# Patient Record
Sex: Female | Born: 1970 | Race: White | Hispanic: Yes | Marital: Single | Smoking: Never smoker
Health system: Southern US, Community
[De-identification: ages and names within clinical notes are randomized; demographics above are authoritative.]

---

## 2002-04-12 ENCOUNTER — Ambulatory Visit (HOSPITAL_COMMUNITY): Admission: RE | Admit: 2002-04-12 | Discharge: 2002-04-12 | Payer: Self-pay | Admitting: *Deleted

## 2002-05-11 ENCOUNTER — Inpatient Hospital Stay (HOSPITAL_COMMUNITY): Admission: AD | Admit: 2002-05-11 | Discharge: 2002-05-13 | Payer: Self-pay | Admitting: *Deleted

## 2007-06-24 ENCOUNTER — Emergency Department (HOSPITAL_COMMUNITY): Admission: EM | Admit: 2007-06-24 | Discharge: 2007-06-24 | Payer: Self-pay | Admitting: Emergency Medicine

## 2008-10-30 ENCOUNTER — Ambulatory Visit (HOSPITAL_COMMUNITY): Admission: RE | Admit: 2008-10-30 | Discharge: 2008-10-30 | Payer: Self-pay | Admitting: Family Medicine

## 2008-11-27 ENCOUNTER — Ambulatory Visit (HOSPITAL_COMMUNITY): Admission: RE | Admit: 2008-11-27 | Discharge: 2008-11-27 | Payer: Self-pay | Admitting: Obstetrics & Gynecology

## 2009-03-06 ENCOUNTER — Inpatient Hospital Stay (HOSPITAL_COMMUNITY): Admission: AD | Admit: 2009-03-06 | Discharge: 2009-03-08 | Payer: Self-pay | Admitting: Obstetrics and Gynecology

## 2009-03-06 ENCOUNTER — Ambulatory Visit: Payer: Self-pay | Admitting: Obstetrics & Gynecology

## 2010-10-16 LAB — CBC
HCT: 39.5 % (ref 36.0–46.0)
Hemoglobin: 13.1 g/dL (ref 12.0–15.0)
MCHC: 33.2 g/dL (ref 30.0–36.0)
MCV: 91.9 fL (ref 78.0–100.0)
RBC: 4.29 MIL/uL (ref 3.87–5.11)
WBC: 9.6 10*3/uL (ref 4.0–10.5)

## 2010-10-16 LAB — RPR: RPR Ser Ql: NONREACTIVE

## 2011-04-18 LAB — CBC
HCT: 39.6
Hemoglobin: 13.3
MCHC: 33.5
MCV: 86.1
Platelets: 264
RDW: 13.1

## 2011-04-18 LAB — PREGNANCY, URINE: Preg Test, Ur: NEGATIVE

## 2011-04-18 LAB — COMPREHENSIVE METABOLIC PANEL
Albumin: 3.3 — ABNORMAL LOW
Alkaline Phosphatase: 79
BUN: 4 — ABNORMAL LOW
Calcium: 9
Creatinine, Ser: 0.8
Glucose, Bld: 94
Potassium: 3.1 — ABNORMAL LOW
Total Protein: 7.9

## 2011-04-18 LAB — URINALYSIS, ROUTINE W REFLEX MICROSCOPIC
Ketones, ur: 15 — AB
Nitrite: POSITIVE — AB
Protein, ur: 30 — AB
pH: 6

## 2011-04-18 LAB — DIFFERENTIAL
Basophils Relative: 0
Lymphocytes Relative: 14
Monocytes Absolute: 1.6 — ABNORMAL HIGH
Monocytes Relative: 10
Neutro Abs: 12.7 — ABNORMAL HIGH
Neutrophils Relative %: 77

## 2011-04-18 LAB — URINE MICROSCOPIC-ADD ON

## 2016-04-16 ENCOUNTER — Emergency Department (HOSPITAL_COMMUNITY): Payer: Commercial Managed Care - PPO

## 2016-04-16 ENCOUNTER — Emergency Department (HOSPITAL_COMMUNITY)
Admission: EM | Admit: 2016-04-16 | Discharge: 2016-04-17 | Disposition: A | Discharge status: Home / Self Care | Payer: Commercial Managed Care - PPO | Attending: Emergency Medicine | Admitting: Emergency Medicine

## 2016-04-16 ENCOUNTER — Encounter (HOSPITAL_COMMUNITY): Payer: Self-pay | Admitting: *Deleted

## 2016-04-16 DIAGNOSIS — R109 Unspecified abdominal pain: Secondary | ICD-10-CM | POA: Diagnosis present

## 2016-04-16 LAB — COMPREHENSIVE METABOLIC PANEL
ALK PHOS: 69 U/L (ref 38–126)
ALT: 16 U/L (ref 14–54)
AST: 21 U/L (ref 15–41)
Albumin: 3.4 g/dL — ABNORMAL LOW (ref 3.5–5.0)
Anion gap: 7 (ref 5–15)
BILIRUBIN TOTAL: 0.5 mg/dL (ref 0.3–1.2)
BUN: 13 mg/dL (ref 6–20)
CALCIUM: 9 mg/dL (ref 8.9–10.3)
CHLORIDE: 111 mmol/L (ref 101–111)
CO2: 22 mmol/L (ref 22–32)
CREATININE: 0.61 mg/dL (ref 0.44–1.00)
Glucose, Bld: 109 mg/dL — ABNORMAL HIGH (ref 65–99)
Potassium: 3.5 mmol/L (ref 3.5–5.1)
Sodium: 140 mmol/L (ref 135–145)
TOTAL PROTEIN: 6.7 g/dL (ref 6.5–8.1)

## 2016-04-16 LAB — PREGNANCY, URINE: PREG TEST UR: NEGATIVE

## 2016-04-16 LAB — URINALYSIS, ROUTINE W REFLEX MICROSCOPIC
Bilirubin Urine: NEGATIVE
GLUCOSE, UA: NEGATIVE mg/dL
Ketones, ur: 15 mg/dL — AB
NITRITE: NEGATIVE
PH: 6 (ref 5.0–8.0)
Protein, ur: NEGATIVE mg/dL
SPECIFIC GRAVITY, URINE: 1.025 (ref 1.005–1.030)

## 2016-04-16 LAB — CBC
HCT: 37.6 % (ref 36.0–46.0)
Hemoglobin: 11.6 g/dL — ABNORMAL LOW (ref 12.0–15.0)
MCH: 25.2 pg — ABNORMAL LOW (ref 26.0–34.0)
MCHC: 30.9 g/dL (ref 30.0–36.0)
MCV: 81.6 fL (ref 78.0–100.0)
PLATELETS: 311 10*3/uL (ref 150–400)
RBC: 4.61 MIL/uL (ref 3.87–5.11)
RDW: 16.1 % — AB (ref 11.5–15.5)
WBC: 8.4 10*3/uL (ref 4.0–10.5)

## 2016-04-16 LAB — URINE MICROSCOPIC-ADD ON

## 2016-04-16 LAB — LIPASE, BLOOD: LIPASE: 32 U/L (ref 11–51)

## 2016-04-16 MED ORDER — ONDANSETRON HCL 4 MG/2ML IJ SOLN
4.0000 mg | Freq: Once | INTRAMUSCULAR | Status: AC
  Administered 2016-04-16: 4 mg via INTRAVENOUS
  Filled 2016-04-16: qty 2

## 2016-04-16 MED ORDER — IOPAMIDOL (ISOVUE-300) INJECTION 61%
INTRAVENOUS | Status: AC
  Administered 2016-04-16: 100 mL
  Filled 2016-04-16: qty 100

## 2016-04-16 MED ORDER — MORPHINE SULFATE (PF) 4 MG/ML IV SOLN
4.0000 mg | Freq: Once | INTRAVENOUS | Status: AC
  Administered 2016-04-16: 4 mg via INTRAVENOUS
  Filled 2016-04-16: qty 1

## 2016-04-16 NOTE — ED Provider Notes (Signed)
[facility]-EMERGENCY DEPT Provider Note   CSN: [id number] Arrival date & time: 04/16/16  1714     History   Chief Complaint Chief Complaint  Patient presents with  . Abdominal Pain    HPI Prescott Gumva P Moreno-Ruiz is a 45 y.o. female.  Patient c/o rlq abd pain for the past day. Pain constant, dull, moderate, non radiating. No specific exacerbating or alleviating factors. Decreased appetite today. No vomiting or diarrhea. No constipation. Denies fevers. No prior abd surgery. No hx gallstones. Denies dysuria/hematuria or gu c/o. lnmp 3 weeks ago, normal. No vaginal discharge or bleeding. Patient indicates has had a similar pain in the past, but not this persistent.    The history is provided by the patient and a relative. A language interpreter was used.  Abdominal Pain   Pertinent negatives include fever, vomiting, dysuria, hematuria and headaches.    History reviewed. No pertinent past medical history.  There are no active problems to display for this patient.   History reviewed. No pertinent surgical history.  OB History    No data available       Home Medications    Prior to Admission medications   Not on File    Family History History reviewed. No pertinent family history.  Social History Social History  Substance Use Topics  . Smoking status: Never Smoker  . Smokeless tobacco: Not on file  . Alcohol use No     Allergies   Review of patient's allergies indicates no known allergies.   Review of Systems Review of Systems  Constitutional: Negative for fever.  HENT: Negative for sore throat.   Eyes: Negative for redness.  Respiratory: Negative for shortness of breath.   Cardiovascular: Negative for chest pain.  Gastrointestinal: Positive for abdominal pain. Negative for vomiting.  Genitourinary: Negative for dysuria, hematuria, vaginal bleeding and vaginal discharge.  Musculoskeletal: Negative for back pain and neck pain.  Skin: Negative for rash.    Neurological: Negative for headaches.  Psychiatric/Behavioral: Negative for confusion.     Physical Exam Updated Vital Signs BP 123/70 (BP Location: Left Arm)   Pulse (!) 54   Temp 97.5 F (36.4 C) (Oral)   Resp 16   LMP 04/04/2016   SpO2 100%   Physical Exam  Constitutional: She appears well-developed and well-nourished. No distress.  Eyes: Conjunctivae are normal. No scleral icterus.  Neck: Neck supple. No tracheal deviation present.  Cardiovascular: Normal rate, regular rhythm, normal heart sounds and intact distal pulses.   No murmur heard. Pulmonary/Chest: Effort normal and breath sounds normal. No respiratory distress.  Abdominal: Soft. Normal appearance and bowel sounds are normal. She exhibits no distension and no mass. There is tenderness. There is no rebound and no guarding. No hernia.  Moderate right mid to lower abd tenderness.   Genitourinary:  Genitourinary Comments: No cva tenderness  Musculoskeletal: She exhibits no edema.  Neurological: She is alert.  Skin: Skin is warm and dry. No rash noted. She is not diaphoretic.  Psychiatric: She has a normal mood and affect.  Nursing note and vitals reviewed.    ED Treatments / Results  Labs (all labs ordered are listed, but only abnormal results are displayed) Results for orders placed or performed during the hospital encounter of 04/16/16  Lipase, blood  Result Value Ref Range   Lipase 32 11 - 51 U/L  Comprehensive metabolic panel  Result Value Ref Range   Sodium 140 135 - 145 mmol/L   Potassium 3.5 3.5 - 5.1 mmol/L  Chloride 111 101 - 111 mmol/L   CO2 22 22 - 32 mmol/L   Glucose, Bld 109 (H) 65 - 99 mg/dL   BUN 13 6 - 20 mg/dL   Creatinine, Ser 1.61 0.44 - 1.00 mg/dL   Calcium 9.0 8.9 - 09.6 mg/dL   Total Protein 6.7 6.5 - 8.1 g/dL   Albumin 3.4 (L) 3.5 - 5.0 g/dL   AST 21 15 - 41 U/L   ALT 16 14 - 54 U/L   Alkaline Phosphatase 69 38 - 126 U/L   Total Bilirubin 0.5 0.3 - 1.2 mg/dL   GFR calc non Af  Amer >60 >60 mL/min   GFR calc Af Amer >60 >60 mL/min   Anion gap 7 5 - 15  CBC  Result Value Ref Range   WBC 8.4 4.0 - 10.5 K/uL   RBC 4.61 3.87 - 5.11 MIL/uL   Hemoglobin 11.6 (L) 12.0 - 15.0 g/dL   HCT 04.5 40.9 - 81.1 %   MCV 81.6 78.0 - 100.0 fL   MCH 25.2 (L) 26.0 - 34.0 pg   MCHC 30.9 30.0 - 36.0 g/dL   RDW 91.4 (H) 78.2 - 95.6 %   Platelets 311 150 - 400 K/uL  Urinalysis, Routine w reflex microscopic  Result Value Ref Range   Color, Urine YELLOW YELLOW   APPearance HAZY (A) CLEAR   Specific Gravity, Urine 1.025 1.005 - 1.030   pH 6.0 5.0 - 8.0   Glucose, UA NEGATIVE NEGATIVE mg/dL   Hgb urine dipstick LARGE (A) NEGATIVE   Bilirubin Urine NEGATIVE NEGATIVE   Ketones, ur 15 (A) NEGATIVE mg/dL   Protein, ur NEGATIVE NEGATIVE mg/dL   Nitrite NEGATIVE NEGATIVE   Leukocytes, UA TRACE (A) NEGATIVE  Urine microscopic-add on  Result Value Ref Range   Squamous Epithelial / LPF 6-30 (A) NONE SEEN   WBC, UA 0-5 0 - 5 WBC/hpf   RBC / HPF TOO NUMEROUS TO COUNT 0 - 5 RBC/hpf   Bacteria, UA FEW (A) NONE SEEN  Pregnancy, urine  Result Value Ref Range   Preg Test, Ur NEGATIVE NEGATIVE   Ct Abdomen Pelvis W Contrast  Result Date: 04/16/2016 CLINICAL DATA:  Acute onset of right lower quadrant abdominal pain. Initial encounter. EXAM: CT ABDOMEN AND PELVIS WITH CONTRAST TECHNIQUE: Multidetector CT imaging of the abdomen and pelvis was performed using the standard protocol following bolus administration of intravenous contrast. CONTRAST:  ISOVUE-300 IOPAMIDOL (ISOVUE-300) INJECTION 61% COMPARISON:  Pelvic ultrasound performed 11/27/2008 FINDINGS: Lower chest: The visualized lung bases are grossly clear. The visualized portions of the mediastinum are unremarkable. Hepatobiliary: The liver is unremarkable in appearance. The gallbladder is unremarkable in appearance. The common bile duct remains normal in caliber. Pancreas: The pancreas is within normal limits. Spleen: The spleen is  unremarkable in appearance. Adrenals/Urinary Tract: The adrenal glands are unremarkable in appearance. The kidneys are within normal limits. There is no evidence of hydronephrosis. No renal or ureteral stones are identified. No perinephric stranding is seen. Stomach/Bowel: The stomach is unremarkable in appearance. The small bowel is within normal limits. The appendix is normal in caliber, without evidence of appendicitis. The colon is unremarkable in appearance. Vascular/Lymphatic: The abdominal aorta is unremarkable in appearance. The inferior vena cava is grossly unremarkable. No retroperitoneal lymphadenopathy is seen. No pelvic sidewall lymphadenopathy is identified. Reproductive: The patient's intrauterine device is noted in abnormal alignment, with 1 of the prongs extending anteriorly through the myometrium to the edge of the uterine wall. The uterus  is otherwise grossly unremarkable. The ovaries are relatively symmetric. No suspicious adnexal masses are seen. The bladder is mildly distended and grossly unremarkable. Other: No additional soft tissue abnormalities are seen. Musculoskeletal: No acute osseous abnormalities are identified. The visualized musculature is unremarkable in appearance. IMPRESSION: 1. Intrauterine device noted in abnormal alignment, with 1 of the prongs extending anteriorly through the myometrium to the edge of the uterine wall. OB/GYN consultation is recommended for repositioning. 2. Otherwise unremarkable contrast-enhanced CT of the abdomen and pelvis. Electronically Signed   By: Roanna Raider M.D.   On: 04/16/2016 23:37    EKG  EKG Interpretation None       Radiology No results found.  Procedures Procedures (including critical care time)  Medications Ordered in ED Medications - No data to display   Initial Impression / Assessment and Plan / ED Course  I have reviewed the triage vital signs and the nursing notes.  Pertinent labs & imaging results that were  available during my care of the patient were reviewed by me and considered in my medical decision making (see chart for details).  Clinical Course    Iv ns. Labs.  CT.  Morphine iv. zofran iv.   Reviewed nursing notes and prior charts for additional history.   Ct scan neg acute for right abd process.  Incidental note made of iud position.  On recheck of pt, there is no pelvic pain or tenderness - will have f/u ob/gyn as outpatient.   Patient afeb, repeat abd exam reassuring, non tender - rec outpt f/u.  Return precautions provided.      Final Clinical Impressions(s) / ED Diagnoses   Final diagnoses:  None    New Prescriptions New Prescriptions   No medications on file     Cathren Laine, MD 04/16/16 2352

## 2016-04-16 NOTE — ED Triage Notes (Addendum)
Pt reports RLQ pain since last night. Denies n/v/d or urinary symptoms. Denies fever. Reports normal bm this morning.

## 2016-04-17 NOTE — Discharge Instructions (Signed)
It was our pleasure to provide your ER care today - we hope that you feel better.  Your ct scan was read as showing no acute infection/process.    On your scan, incidental note was made of:  Intrauterine device noted in abnormal alignment, with 1 of the prongs extending anteriorly through the myometrium to the edge of the uterine wall - for this, follow up with ob/gyn doctor in the coming week, and discuss possibly re-positioning of your iud - call your ob/gyn doctor this Monday AM to arrange that follow up with them.    Return to ER right away if worse, new symptoms, fevers, worsening or severe abdominal pain, other concern.   You were given pain medication in the ER - no driving for the next 4 hours.

## 2016-04-17 NOTE — ED Notes (Signed)
Pt dc papers reviewed. Pt verbalizes understanding and denies having any questions. Pt attempted to sign dc but pen and pad not working.

## 2017-05-08 IMAGING — CT CT ABD-PELV W/ CM
2 of 5 series · 15 of 46 positions shown, 17 images · IV contrast (Omni 300)
Comparison: Pelvic ultrasound performed 11/27/2008

CLINICAL DATA: Acute onset of right lower quadrant abdominal pain.
Initial encounter.

EXAM:
CT ABDOMEN AND PELVIS WITH CONTRAST
TECHNIQUE: Multidetector CT imaging of the abdomen and pelvis was performed
using the standard protocol following bolus administration of
intravenous contrast.
CONTRAST:  100mL JR32HI-E22 IOPAMIDOL (JR32HI-E22) INJECTION 61%

[Series 2: a/p w/ 5mm · axial · 0.64mm/px · z∈[-738,-333]mm · 12 of 93 slices shown, 14 images]
[im 6/93  soft-tissue]
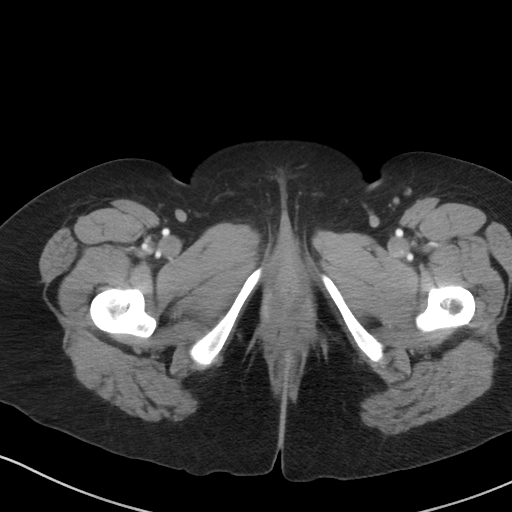
[im 6/93  bone]
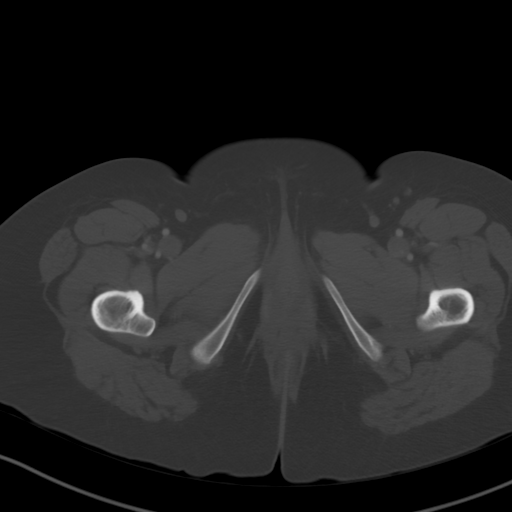
[im 16/93  soft-tissue]
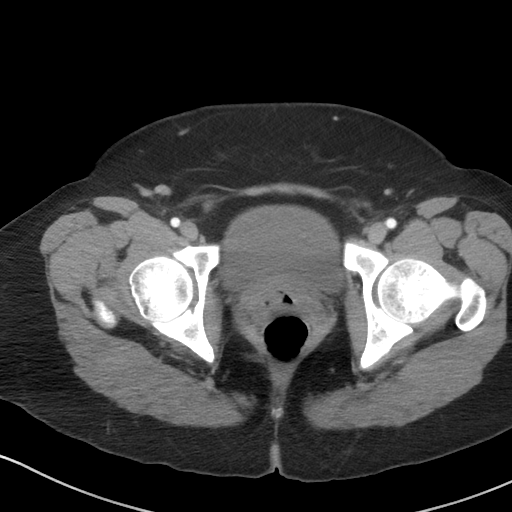
[im 21/93  soft-tissue]
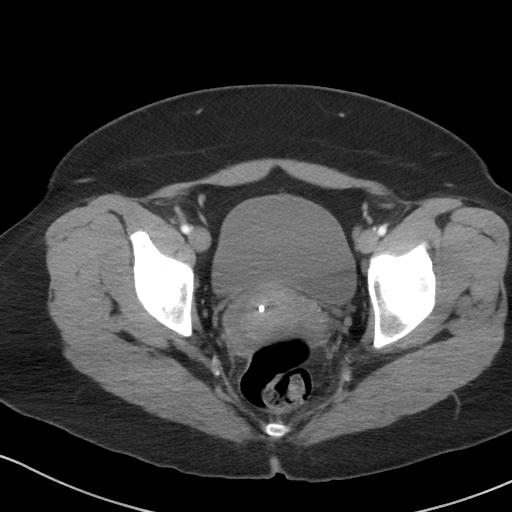
[im 26/93  soft-tissue]
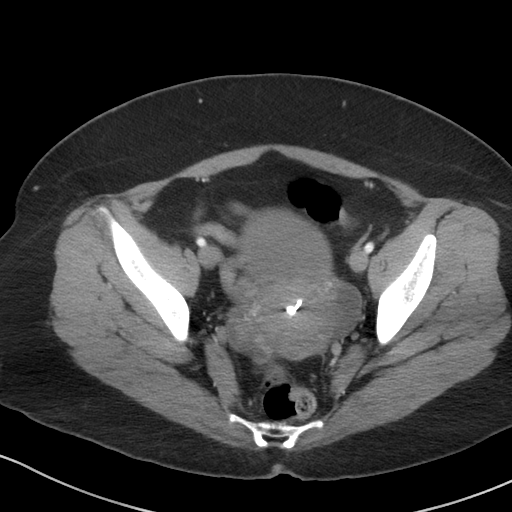
[im 36/93  soft-tissue]
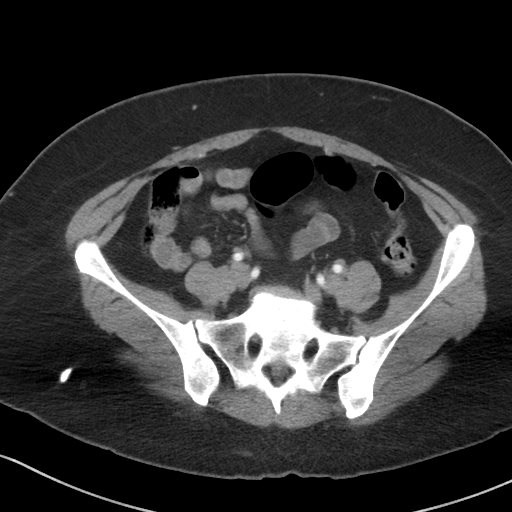
[im 41/93  soft-tissue]
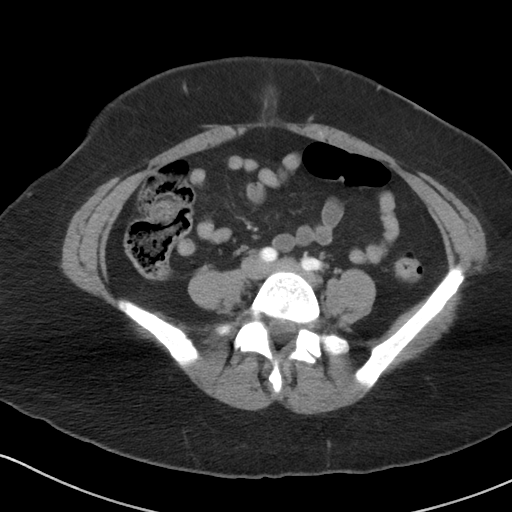
[im 52/93  soft-tissue]
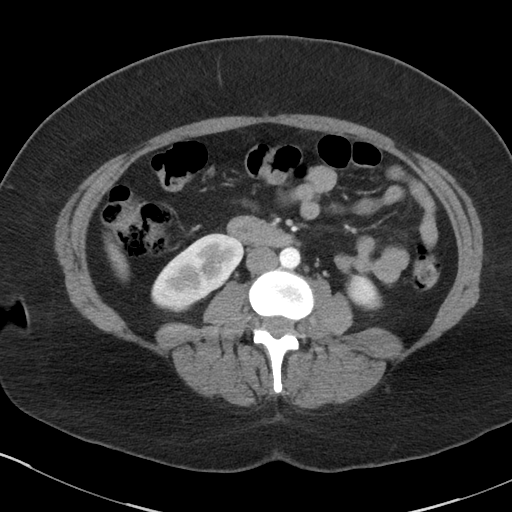
[im 57/93  soft-tissue]
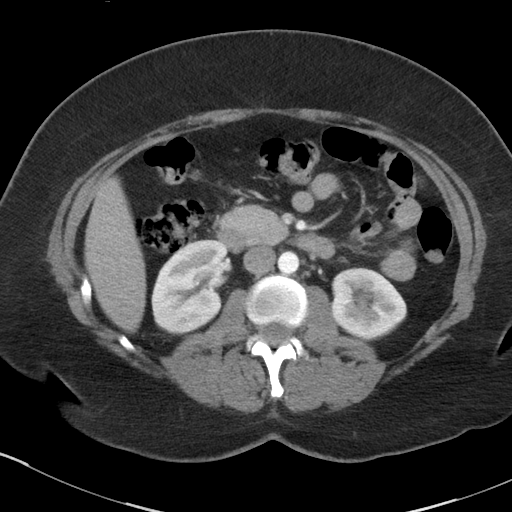
[im 67/93  soft-tissue]
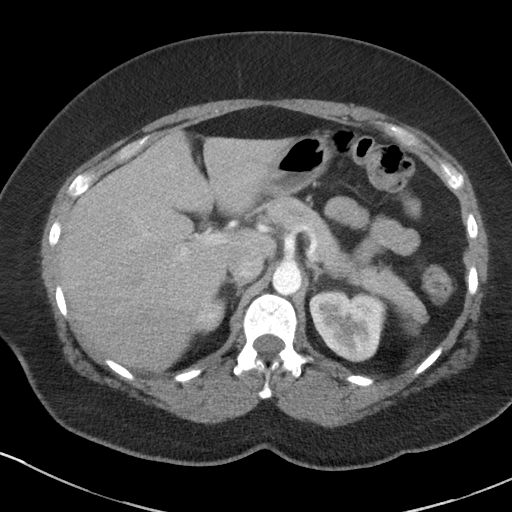
[im 67/93  bone]
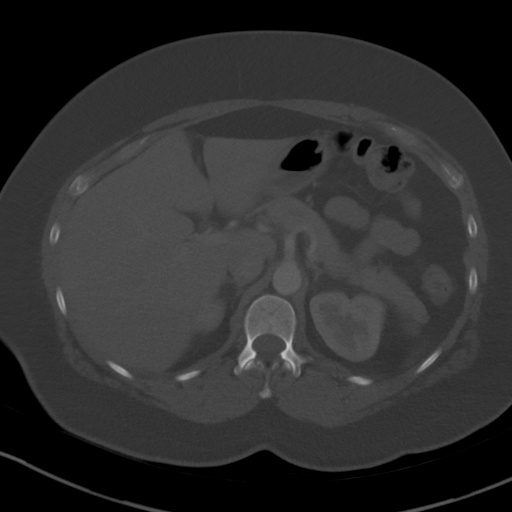
[im 72/93  soft-tissue]
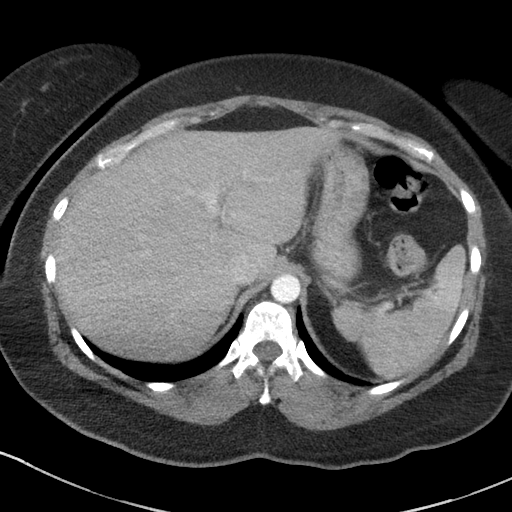
[im 77/93  soft-tissue]
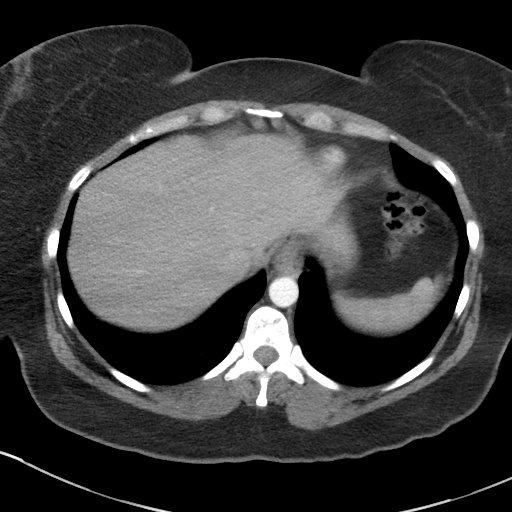
[im 87/93  soft-tissue]
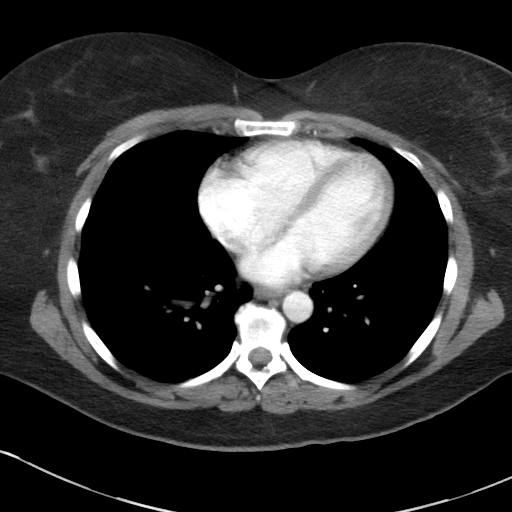

[Series 4: a/p w/ cor · coronal · 0.72mm/px · 3 of 136 slices shown]
[im 46/136  soft-tissue]
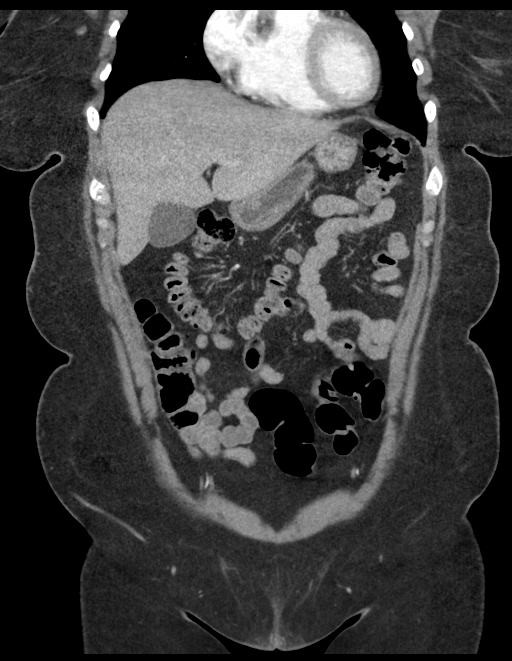
[im 61/136  soft-tissue]
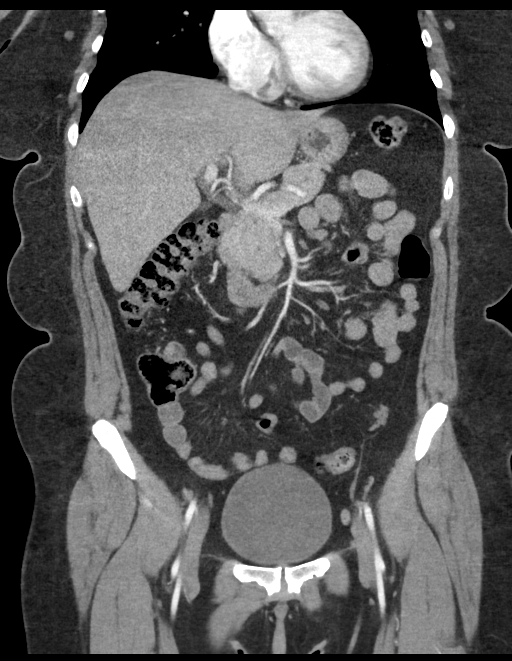
[im 76/136  soft-tissue]
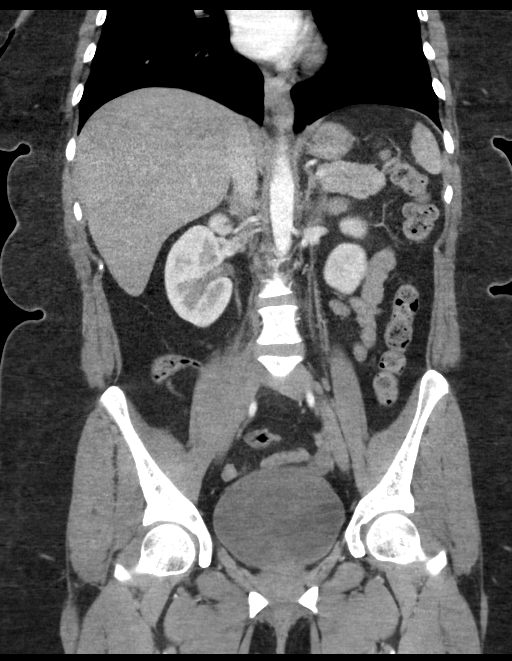

[15 of 46 positions shown; findings below may reference images not displayed]

FINDINGS: Lower chest: The visualized lung bases are grossly clear. The
visualized portions of the mediastinum are unremarkable.

Hepatobiliary: The liver is unremarkable in appearance. The
gallbladder is unremarkable in appearance. The common bile duct
remains normal in caliber.

Pancreas: The pancreas is within normal limits.

Spleen: The spleen is unremarkable in appearance.

Adrenals/Urinary Tract: The adrenal glands are unremarkable in
appearance. The kidneys are within normal limits. There is no
evidence of hydronephrosis. No renal or ureteral stones are
identified. No perinephric stranding is seen.

Stomach/Bowel: The stomach is unremarkable in appearance. The small
bowel is within normal limits. The appendix is normal in caliber,
without evidence of appendicitis. The colon is unremarkable in
appearance.

Vascular/Lymphatic: The abdominal aorta is unremarkable in
appearance. The inferior vena cava is grossly unremarkable. No
retroperitoneal lymphadenopathy is seen. No pelvic sidewall
lymphadenopathy is identified.

Reproductive: The patient's intrauterine device is noted in abnormal
alignment, with 1 of the prongs extending anteriorly through the
myometrium to the edge of the uterine wall. The uterus is otherwise
grossly unremarkable. The ovaries are relatively symmetric. No
suspicious adnexal masses are seen. The bladder is mildly distended
and grossly unremarkable.

Other: No additional soft tissue abnormalities are seen.

Musculoskeletal: No acute osseous abnormalities are identified. The
visualized musculature is unremarkable in appearance.
IMPRESSION: 1. Intrauterine device noted in abnormal alignment, with 1 of the
prongs extending anteriorly through the myometrium to the edge of
the uterine wall. OB/GYN consultation is recommended for
repositioning.
2. Otherwise unremarkable contrast-enhanced CT of the abdomen and
pelvis.

## 2017-10-10 ENCOUNTER — Other Ambulatory Visit: Payer: Self-pay | Admitting: Obstetrics & Gynecology

## 2017-10-10 DIAGNOSIS — Z1231 Encounter for screening mammogram for malignant neoplasm of breast: Secondary | ICD-10-CM

## 2019-07-13 ENCOUNTER — Encounter (HOSPITAL_COMMUNITY): Payer: Self-pay | Admitting: Emergency Medicine

## 2019-07-13 ENCOUNTER — Emergency Department (HOSPITAL_COMMUNITY)
Admission: EM | Admit: 2019-07-13 | Discharge: 2019-07-13 | Disposition: A | Discharge status: Home / Self Care | Payer: HRSA Program | Attending: Emergency Medicine | Admitting: Emergency Medicine

## 2019-07-13 ENCOUNTER — Other Ambulatory Visit: Payer: Self-pay

## 2019-07-13 DIAGNOSIS — U071 COVID-19: Secondary | ICD-10-CM | POA: Insufficient documentation

## 2019-07-13 DIAGNOSIS — R519 Headache, unspecified: Secondary | ICD-10-CM | POA: Diagnosis present

## 2019-07-13 DIAGNOSIS — Z20822 Contact with and (suspected) exposure to covid-19: Secondary | ICD-10-CM

## 2019-07-13 DIAGNOSIS — B349 Viral infection, unspecified: Secondary | ICD-10-CM

## 2019-07-13 LAB — POC SARS CORONAVIRUS 2 AG -  ED: SARS Coronavirus 2 Ag: NEGATIVE

## 2019-07-13 MED ORDER — ACETAMINOPHEN 325 MG PO TABS
650.0000 mg | ORAL_TABLET | Freq: Once | ORAL | Status: AC
  Administered 2019-07-13: 650 mg via ORAL
  Filled 2019-07-13: qty 2

## 2019-07-13 MED ORDER — BENZONATATE 100 MG PO CAPS: 100.0000 mg | ORAL_CAPSULE | Freq: Three times a day (TID) | ORAL | 0 refills | Status: AC

## 2019-07-13 NOTE — ED Notes (Signed)
Pt verbalized understanding of d/c instructions, prescriptions, follow up care, and s/s requiring return to ed. Pt had no further questions at thistime.

## 2019-07-13 NOTE — ED Provider Notes (Signed)
[facility] EMERGENCY DEPARTMENT Provider Note   CSN: [id number] Arrival date & time: 07/13/19  1836     History Chief Complaint  Patient presents with  . Generalized Body Aches  . Headache    Samantha Burke is a 49 y.o. female.  Patient presents to the emergency department with complaint of headache, generalized body ache, of loss of taste and smell, cough.  She had a temperature of 100.9 F in the emergency department.  This is day 9 of illness which started on 12/24.  To me, she states that she is not significantly short of breath, this is in contrast to the nursing note.  Patient has been using over-the-counter medications without improvement.  She denies any known sick contacts with coronavirus or otherwise.  Onset of symptoms gradual.  Course is worsening.  Nothing makes symptoms better or worse.  History taken with the use of video interpreter.        History reviewed. No pertinent past medical history.  There are no problems to display for this patient.   History reviewed. No pertinent surgical history.   OB History   No obstetric history on file.     No family history on file.  Social History   Tobacco Use  . Smoking status: Never Smoker  Substance Use Topics  . Alcohol use: No  . Drug use: No    Home Medications Prior to Admission medications   Medication Sig Start Date End Date Taking? Authorizing Provider  benzonatate (TESSALON) 100 MG capsule Take 1 capsule (100 mg total) by mouth every 8 (eight) hours. 07/13/19   Carlisle Cater, PA-C    Allergies    Patient has no known allergies.  Review of Systems   Review of Systems  Constitutional: Positive for fever (fever in ED). Negative for chills and fatigue.  HENT: Positive for congestion. Negative for ear pain, rhinorrhea, sinus pressure and sore throat.        + change in taste/smell  Eyes: Negative for redness.  Respiratory: Positive for cough. Negative for shortness of breath  and wheezing.   Gastrointestinal: Negative for abdominal pain, diarrhea, nausea and vomiting.  Genitourinary: Negative for dysuria.  Musculoskeletal: Positive for myalgias. Negative for neck stiffness.  Skin: Negative for rash.  Neurological: Positive for headaches.  Hematological: Negative for adenopathy.    Physical Exam Updated Vital Signs BP (!) 127/93 (BP Location: Right Arm)   Pulse 92   Temp (!) 100.9 F (38.3 C) (Oral)   Resp 16   SpO2 97%   Physical Exam Vitals and nursing note reviewed.  Constitutional:      Appearance: She is well-developed.  HENT:     Head: Normocephalic and atraumatic.     Jaw: No trismus.     Right Ear: Tympanic membrane, ear canal and external ear normal.     Left Ear: Tympanic membrane, ear canal and external ear normal.     Nose: Nose normal. No mucosal edema or rhinorrhea.     Mouth/Throat:     Mouth: Mucous membranes are not dry. No oral lesions.     Pharynx: Uvula midline. No oropharyngeal exudate, posterior oropharyngeal erythema or uvula swelling.     Tonsils: No tonsillar abscesses.  Eyes:     General:        Right eye: No discharge.        Left eye: No discharge.     Conjunctiva/sclera: Conjunctivae normal.  Cardiovascular:     Rate and Rhythm:  Normal rate and regular rhythm.     Heart sounds: Normal heart sounds.  Pulmonary:     Effort: Pulmonary effort is normal. No respiratory distress.     Breath sounds: Normal breath sounds. No wheezing or rales.     Comments: Patient occasionally coughing during exam.  Speaks in full sentences, no apparent distress accessory muscle use. Abdominal:     Palpations: Abdomen is soft.     Tenderness: There is no abdominal tenderness.  Musculoskeletal:     Cervical back: Normal range of motion and neck supple.  Lymphadenopathy:     Cervical: No cervical adenopathy.  Skin:    General: Skin is warm and dry.  Neurological:     Mental Status: She is alert.     ED Results / Procedures /  Treatments   Labs (all labs ordered are listed, but only abnormal results are displayed) Labs Reviewed  SARS CORONAVIRUS 2 (TAT 6-24 HRS)  POC SARS CORONAVIRUS 2 AG -  ED    EKG None  Radiology No results found.  Procedures Procedures (including critical care time)  Medications Ordered in ED Medications - No data to display  ED Course  I have reviewed the triage vital signs and the nursing notes.  Pertinent labs & imaging results that were available during my care of the patient were reviewed by me and considered in my medical decision making (see chart for details).  Patient seen and examined.  Patient is well-appearing.  Rapid coronavirus testing negative.  And send out test.  High suspicion for coronavirus given history.  Although her symptoms are mild.  She is in no respiratory distress and looks well.  Feel that she can be discharged home with precautions and isolation instructions.  Vital signs reviewed and are as follows: BP (!) 127/93 (BP Location: Right Arm)   Pulse 92   Temp (!) 100.9 F (38.3 C) (Oral)   Resp 16   SpO2 97%   Detailed discussion had with with patient regarding COVID-19 precautions and written instructions given as well.  We discussed need to isolate themselves for 14 days from onset of symptoms and have 72 hours of improvement prior to breaking isolation.  We discussed signs symptoms to return which include worsening shortness of breath, trouble breathing, or increased work of breathing.  Also return with persistent vomiting, confusion, passing out, or if they have any other concerns. Counseled on the need for rest and good hydration. Discussed that high-risk contacts should be aware of positive result and they need to quarantine and be tested if they develop any symptoms. Patient verbalizes understanding.       MDM Rules/Calculators/A&P                      Patient with viral respiratory illness, viral syndrome.  Symptoms are concerning for  COVID-19 infection.  Patient has normal vital signs and is in no respiratory distress.  Do not feel that further work-up or evaluation is indicated at this time.  She can isolate at home and monitor symptoms for worsening and return if these occur.    Final Clinical Impression(s) / ED Diagnoses Final diagnoses:  Viral illness  Suspected COVID-19 virus infection    Rx / DC Orders ED Discharge Orders         Ordered    benzonatate (TESSALON) 100 MG capsule  Every 8 hours     07/13/19 2044           Jong Rickman,  Glo Herring 07/13/19 2052    Milagros Loll, MD 07/15/19 1325

## 2019-07-13 NOTE — ED Triage Notes (Signed)
Pt reports head pain, generalized body aches, SOB and loss of smell for a few days. Denies SOB at this time.

## 2019-07-13 NOTE — Discharge Instructions (Signed)
Please read and follow all provided instructions.  Your diagnoses today include:  1. Viral illness   2. Suspected COVID-19 virus infection     Tests performed today include:  Vital signs. See below for your results today.   Rapid coronavirus test was negative, however this is not accurate unless it is positive.  Send out corona virus test is pending and will return in 1 to 2 days.  Medications prescribed:   Tessalon Perles - cough suppressant medication  Take any prescribed medications only as directed. Treatment for your infection is aimed at treating the symptoms. There are no medications, such as antibiotics, that will cure your infection.   Home care instructions:  Follow any educational materials contained in this packet.   Your illness is contagious and can be spread to others, especially during the first 3 or 4 days. It cannot be cured by antibiotics or other medicines. Take basic precautions such as washing your hands often, covering your mouth when you cough or sneeze, and avoiding public places where you could spread your illness to others.   Please continue drinking plenty of fluids.  Use over-the-counter medicines as needed as directed on packaging for symptom relief.  You may also use ibuprofen or tylenol as directed on packaging for pain or fever.  Do not take multiple medicines containing Tylenol or acetaminophen to avoid taking too much of this medication.   Follow-up instructions: Please follow-up with your primary care provider in the next 3 days for further evaluation of your symptoms if you are not feeling better.   Return instructions:   Please return to the Emergency Department if you experience worsening symptoms.   RETURN IMMEDIATELY IF you develop shortness of breath, confusion or altered mental status, a new rash, become dizzy, faint, or poorly responsive, or are unable to be cared for at home.  Please return if you have persistent vomiting and cannot  keep down fluids or develop a fever that is not controlled by tylenol or motrin.    Please return if you have any other emergent concerns.  Additional Information:  Your vital signs today were: BP (!) 127/93 (BP Location: Right Arm)   Pulse 92   Temp (!) 100.9 F (38.3 C) (Oral)   Resp 16   SpO2 97%  If your blood pressure (BP) was elevated above 135/85 this visit, please have this repeated by your doctor within one month. --------------

## 2019-07-14 LAB — SARS CORONAVIRUS 2 (TAT 6-24 HRS): SARS Coronavirus 2: POSITIVE — AB

## 2020-11-18 ENCOUNTER — Encounter (HOSPITAL_COMMUNITY): Payer: Self-pay

## 2020-11-18 ENCOUNTER — Emergency Department (HOSPITAL_COMMUNITY)
Admission: EM | Admit: 2020-11-18 | Discharge: 2020-11-19 | Disposition: A | Discharge status: Home / Self Care | Payer: Commercial Managed Care - PPO | Attending: Emergency Medicine | Admitting: Emergency Medicine

## 2020-11-18 DIAGNOSIS — T2010XA Burn of first degree of head, face, and neck, unspecified site, initial encounter: Secondary | ICD-10-CM | POA: Insufficient documentation

## 2020-11-18 DIAGNOSIS — Z23 Encounter for immunization: Secondary | ICD-10-CM | POA: Insufficient documentation

## 2020-11-18 DIAGNOSIS — X110XXA Contact with hot water in bath or tub, initial encounter: Secondary | ICD-10-CM | POA: Insufficient documentation
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
## 2020-11-19 ENCOUNTER — Encounter (HOSPITAL_COMMUNITY): Payer: Self-pay | Admitting: Student

## 2020-11-19 MED ORDER — FLUORESCEIN SODIUM 1 MG OP STRP
1.0000 | ORAL_STRIP | Freq: Once | OPHTHALMIC | Status: AC
  Administered 2020-11-19: 1 via OPHTHALMIC
  Filled 2020-11-19: qty 1

## 2020-11-19 MED ORDER — TETRACAINE HCL 0.5 % OP SOLN
1.0000 [drp] | Freq: Once | OPHTHALMIC | Status: AC
  Administered 2020-11-19: 1 [drp] via OPHTHALMIC
  Filled 2020-11-19: qty 4

## 2020-11-19 MED ORDER — TETANUS-DIPHTH-ACELL PERTUSSIS 5-2.5-18.5 LF-MCG/0.5 IM SUSY
0.5000 mL | PREFILLED_SYRINGE | Freq: Once | INTRAMUSCULAR | Status: AC
  Administered 2020-11-19: 0.5 mL via INTRAMUSCULAR
  Filled 2020-11-19: qty 0.5
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
## 2021-08-22 ENCOUNTER — Ambulatory Visit (HOSPITAL_COMMUNITY)
Admission: EM | Admit: 2021-08-22 | Discharge: 2021-08-22 | Disposition: A | Discharge status: Home / Self Care | Payer: Self-pay | Attending: Emergency Medicine | Admitting: Emergency Medicine

## 2021-08-22 ENCOUNTER — Other Ambulatory Visit: Payer: Self-pay

## 2021-08-22 ENCOUNTER — Encounter (HOSPITAL_COMMUNITY): Payer: Self-pay | Admitting: Emergency Medicine

## 2021-08-22 DIAGNOSIS — L718 Other rosacea: Secondary | ICD-10-CM

## 2021-08-22 MED ORDER — TRETINOIN 0.025 % EX CREA: TOPICAL_CREAM | CUTANEOUS | 2 refills | Status: AC

## 2021-08-22 MED ORDER — DOXYCYCLINE HYCLATE 100 MG PO TBEC: 100.0000 mg | DELAYED_RELEASE_TABLET | Freq: Two times a day (BID) | ORAL | 2 refills | Status: AC

## 2021-08-22 MED ORDER — TRETINOIN 0.025 % EX CREA: TOPICAL_CREAM | CUTANEOUS | 2 refills | Status: DC

## 2021-08-22 MED ORDER — DOXYCYCLINE HYCLATE 100 MG PO TBEC: 100.0000 mg | DELAYED_RELEASE_TABLET | Freq: Two times a day (BID) | ORAL | 2 refills | Status: DC

## 2021-08-22 NOTE — ED Triage Notes (Signed)
Pt reports facial rash on cheeks x 3 months.

## 2021-08-22 NOTE — ED Provider Notes (Signed)
[facility]    CSN: [id number] Arrival date & time: 08/22/21  1717    HISTORY   Chief Complaint  Patient presents with   Rash   HPI [name] is a 51 y.o. female. Patient complains of having a rash on her cheeks for the past 3 months.  Patient states the rash is new.  Patient states she used to use ponds moisturizer until the rash appeared, then stopped using it.  Patient states she does not wash her face at all has not been applying anything to the skin on her cheeks.  Patient states the rash is intermittently itchy.  States that pustules appear and then go away without having to be "popped" and do not drain on their own.  Patient denies any crusting or drainage from the pustules.  Interpretation today is provided by patient's daughter, daughter states that no one else at home has a similar rash.  The history is provided by the patient.  History reviewed. No pertinent past medical history. There are no problems to display for this patient.  History reviewed. No pertinent surgical history. OB History   No obstetric history on file.    Home Medications    Prior to Admission medications   Medication Sig Start Date End Date Taking? Authorizing Provider  benzonatate (TESSALON) 100 MG capsule Take 1 capsule (100 mg total) by mouth every 8 (eight) hours. 07/13/19   Renne Crigler, PA-C    Family History Family History  Problem Relation Age of Onset   Healthy Mother    Social History Social History   Tobacco Use   Smoking status: Never  Substance Use Topics   Alcohol use: No   Drug use: No   Allergies   Patient has no known allergies.  Review of Systems Review of Systems Pertinent findings noted in history of present illness.   Physical Exam Triage Vital Signs ED Triage Vitals  Enc Vitals Group     BP 05/07/21 0827 (!) 147/82     Pulse Rate 05/07/21 0827 72     Resp 05/07/21 0827 18     Temp 05/07/21 0827 98.3 F (36.8 C)     Temp Source  05/07/21 0827 Oral     SpO2 05/07/21 0827 98 %     Weight --      Height --      Head Circumference --      Peak Flow --      Pain Score 05/07/21 0826 5     Pain Loc --      Pain Edu? --      Excl. in GC? --   No data found.  Updated Vital Signs BP 137/86 (BP Location: Right Arm)    Pulse 63    Temp 98.1 F (36.7 C) (Oral)    Resp 16    Ht 5' (1.524 m)    SpO2 100%   Physical Exam Vitals and nursing note reviewed.  Constitutional:      General: She is not in acute distress.    Appearance: Normal appearance. She is not ill-appearing.  HENT:     Head: Normocephalic and atraumatic.  Eyes:     General: Lids are normal.        Right eye: No discharge.        Left eye: No discharge.     Extraocular Movements: Extraocular movements intact.     Conjunctiva/sclera: Conjunctivae normal.     Right eye: Right conjunctiva is not  injected.     Left eye: Left conjunctiva is not injected.  Neck:     Trachea: Trachea and phonation normal.  Cardiovascular:     Rate and Rhythm: Normal rate and regular rhythm.     Pulses: Normal pulses.     Heart sounds: Normal heart sounds. No murmur heard.   No friction rub. No gallop.  Pulmonary:     Effort: Pulmonary effort is normal. No accessory muscle usage, prolonged expiration or respiratory distress.     Breath sounds: Normal breath sounds. No stridor, decreased air movement or transmitted upper airway sounds. No decreased breath sounds, wheezing, rhonchi or rales.  Chest:     Chest wall: No tenderness.  Musculoskeletal:        General: Normal range of motion.     Cervical back: Normal range of motion and neck supple. Normal range of motion.  Lymphadenopathy:     Cervical: No cervical adenopathy.  Skin:    General: Skin is warm and dry.     Findings: Lesion (TNTC papular lesions scattered across both cheeks all at various stages of healing without any drainage, excoriation or scale.) present. No erythema or rash.  Neurological:     General:  No focal deficit present.     Mental Status: She is alert and oriented to person, place, and time.  Psychiatric:        Mood and Affect: Mood normal.        Behavior: Behavior normal.    Visual Acuity Right Eye Distance:   Left Eye Distance:   Bilateral Distance:    Right Eye Near:   Left Eye Near:    Bilateral Near:     UC Couse / Diagnostics / Procedures:    EKG  Radiology No results found.  Procedures Procedures (including critical care time)  UC Diagnoses / Final Clinical Impressions(s)   I have reviewed the triage vital signs and the nursing notes.  Pertinent labs & imaging results that were available during my care of the patient were reviewed by me and considered in my medical decision making (see chart for details).    Final diagnoses:  Acne rosacea, papular type   Patient skin is otherwise healthy in appearance.  Patient denies use of alcohol.  Patient states he does not smoke.  Believe the patient is suffering from acne rosacea and we will treat patient with doxycycline and topical Retin-A every other day at bedtime.  Patient also advised to clean her face twice daily using a very mild cleanser such as Cetaphil and apply a mild moisturizer of the same brand.  Return precautions advised.  Patient also advised that I have initiated a request to help her find a primary care provider for follow-up of her rash and health maintenance.  ED Prescriptions     Medication Sig Dispense Auth. Provider   doxycycline (DORYX) 100 MG EC tablet  (Status: Discontinued) Take 1 tablet (100 mg total) by mouth 2 (two) times daily. 60 tablet Theadora Rama Scales, PA-C   tretinoin (RETIN-A) 0.025 % cream  (Status: Discontinued) Apply a very small amount to affected areas every other day at bedtime. 20 g Theadora Rama Scales, PA-C   doxycycline (DORYX) 100 MG EC tablet Take 1 tablet (100 mg total) by mouth 2 (two) times daily. 60 tablet Theadora Rama Scales, PA-C   tretinoin (RETIN-A)  0.025 % cream Apply a very small amount to affected areas every other day at bedtime. 20 g Theadora Rama Scales, PA-C  PDMP not reviewed this encounter.  Pending results:  Labs Reviewed - No data to display  Medications Ordered in UC: Medications - No data to display  Disposition Upon Discharge:  Condition: stable for discharge home Home: take medications as prescribed; routine discharge instructions as discussed; follow up as advised.  Patient presented with an acute illness with associated systemic symptoms and significant discomfort requiring urgent management. In my opinion, this is a condition that a prudent lay person (someone who possesses an average knowledge of health and medicine) may potentially expect to result in complications if not addressed urgently such as respiratory distress, impairment of bodily function or dysfunction of bodily organs.   Routine symptom specific, illness specific and/or disease specific instructions were discussed with the patient and/or caregiver at length.   As such, the patient has been evaluated and assessed, work-up was performed and treatment was provided in alignment with urgent care protocols and evidence based medicine.  Patient/parent/caregiver has been advised that the patient may require follow up for further testing and treatment if the symptoms continue in spite of treatment, as clinically indicated and appropriate.  If the patient was tested for COVID-19, Influenza and/or RSV, then the patient/parent/guardian was advised to isolate at home pending the results of his/her diagnostic coronavirus test and potentially longer if theyre positive. I have also advised pt that if his/her COVID-19 test returns positive, it's recommended to self-isolate for at least 10 days after symptoms first appeared AND until fever-free for 24 hours without fever reducer AND other symptoms have improved or resolved. Discussed self-isolation recommendations as  well as instructions for household member/close contacts as per the [facility] and [place] DHHS, and also gave patient the COVID packet with this information.  Patient/parent/caregiver has been advised to return to the [facility] or PCP in 3-5 days if no better; to PCP or the Emergency Department if new signs and symptoms develop, or if the current signs or symptoms continue to change or worsen for further workup, evaluation and treatment as clinically indicated and appropriate  The patient will follow up with their current PCP if and as advised. If the patient does not currently have a PCP we will assist them in obtaining one.   The patient may need specialty follow up if the symptoms continue, in spite of conservative treatment and management, for further workup, evaluation, consultation and treatment as clinically indicated and appropriate.   Patient/parent/caregiver verbalized understanding and agreement of plan as discussed.  All questions were addressed during visit.  Please see discharge instructions below for further details of plan.  Discharge Instructions:   Discharge Instructions      Please read the enclosed information about acne rosacea.  I prescribed the following medications for you:  Doxycycline: Please take 1 tablet twice daily for the next 30 days.  If you see that your rash is improved they would like to continue, I have provided you with 2 refills.  Further refills will need to be provided by either a dermatologist or primary care provider.  Tretinoin cream: Please apply a very small amount to affected areas of the face every other day at bedtime before moisturizing.  If you find this cream is working well and is not causing excessive irritation, I provided you with 2 refills.  Further refills will need to be provided by either dermatologist or primary care provider.  In addition to these medications, I recommend that she wash your face twice daily using Cetaphil gentle skin cleanser and pat  dry.  Once your skin is dry, please apply Cetaphil moisturizing lotion.    If you need to apply tretinoin cream, be sure to apply this before applying the moisturizing lotion.  I have initiated request to help you find a primary care provider not just for your skin but for routine health maintenance and regular follow-up.  Thank you for visiting urgent care today.    This office note has been dictated using Teaching laboratory technicianDragon speech recognition software.  Unfortunately, and despite my best efforts, this method of dictation can sometimes lead to occasional typographical or grammatical errors.  I apologize in advance if this occurs.     Theadora RamaMorgan, Hart Haas Scales, PA-C 08/22/21 1843

## 2021-08-22 NOTE — Discharge Instructions (Addendum)
Please read the enclosed information about acne rosacea.  I prescribed the following medications for you:  Doxycycline: Please take 1 tablet twice daily for the next 30 days.  If you see that your rash is improved they would like to continue, I have provided you with 2 refills.  Further refills will need to be provided by either a dermatologist or primary care provider.  Tretinoin cream: Please apply a very small amount to affected areas of the face every other day at bedtime before moisturizing.  If you find this cream is working well and is not causing excessive irritation, I provided you with 2 refills.  Further refills will need to be provided by either dermatologist or primary care provider.  In addition to these medications, I recommend that she wash your face twice daily using Cetaphil gentle skin cleanser and pat dry.    Once your skin is dry, please apply Cetaphil moisturizing lotion.    If you need to apply tretinoin cream, be sure to apply this before applying the moisturizing lotion.  I have initiated request to help you find a primary care provider not just for your skin but for routine health maintenance and regular follow-up.  Thank you for visiting urgent care today.

## 2021-09-02 ENCOUNTER — Telehealth (HOSPITAL_COMMUNITY): Payer: Self-pay | Admitting: Emergency Medicine

## 2021-09-02 NOTE — Telephone Encounter (Signed)
Received notification from clinic staff that patient was requesting assistance to have prescription sent to a different pharmacy.  Attempted to reach patient on home, it was husband and he provided cell number.  Attempted patient at that number, LVM
